# Patient Record
Sex: Male | Born: 2005 | Race: Black or African American | Hispanic: No | Marital: Single | Smoking: Never smoker
Health system: Southern US, Community
[De-identification: ages and names within clinical notes are randomized; demographics above are authoritative.]

---
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
## 2024-01-10 ENCOUNTER — Ambulatory Visit (HOSPITAL_COMMUNITY)
Admission: EM | Admit: 2024-01-10 | Discharge: 2024-01-10 | Disposition: A | Discharge status: Home / Self Care | Payer: Self-pay | Attending: Internal Medicine | Admitting: Internal Medicine

## 2024-01-10 ENCOUNTER — Encounter (HOSPITAL_COMMUNITY): Payer: Self-pay | Admitting: Emergency Medicine

## 2024-01-10 DIAGNOSIS — R0989 Other specified symptoms and signs involving the circulatory and respiratory systems: Secondary | ICD-10-CM

## 2024-01-10 DIAGNOSIS — R21 Rash and other nonspecific skin eruption: Secondary | ICD-10-CM

## 2024-01-10 DIAGNOSIS — R051 Acute cough: Secondary | ICD-10-CM

## 2024-01-10 LAB — POCT INFLUENZA A/B
Influenza A, POC: NEGATIVE
Influenza B, POC: NEGATIVE

## 2024-01-10 LAB — POC SOFIA SARS ANTIGEN FIA: SARS Coronavirus 2 Ag: NEGATIVE

## 2024-01-10 MED ORDER — FLUTICASONE PROPIONATE 50 MCG/ACT NA SUSP: 1.0000 | Freq: Every day | NASAL | 0 refills | Status: AC

## 2024-01-10 MED ORDER — CLOTRIMAZOLE 1 % EX CREA: TOPICAL_CREAM | CUTANEOUS | 0 refills | Status: AC

## 2024-01-10 MED ORDER — CETIRIZINE HCL 10 MG PO TABS: 10.0000 mg | ORAL_TABLET | Freq: Every day | ORAL | 0 refills | Status: AC

## 2024-01-10 NOTE — ED Provider Notes (Signed)
 [facility]    CSN: [id number] Arrival date & time: 01/10/24  1156      History   Chief Complaint Chief Complaint  Patient presents with   Abscess   Cough   Nasal Congestion    HPI Jorge Baxter is a 18 y.o. male.   Patient presents with 2 different chief complaints today.  Patient reports rash in bilateral axilla that been present for about a month.  He states this is the first time he has been evaluated since the symptoms started.  Rash is not painful.  Patient also presents with dry cough and runny nose that has been present for a few days.  Denies fever, body aches, chills.  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  Denies history of asthma.  He has not taken any medication for symptoms.   Abscess Cough   History reviewed. No pertinent past medical history.  There are no active problems to display for this patient.   History reviewed. No pertinent surgical history.     Home Medications    Prior to Admission medications   Medication Sig Start Date End Date Taking? Authorizing Provider  cetirizine  (ZYRTEC ) 10 MG tablet Take 1 tablet (10 mg total) by mouth daily. 01/10/24  Yes [place], Darryle E, FNP  clotrimazole  (LOTRIMIN ) 1 % cream Apply to affected area 2 times daily. Apply to armpits. 01/10/24  Yes Lewi Drost, Darryle BRAVO, FNP  fluticasone  (FLONASE ) 50 MCG/ACT nasal spray Place 1 spray into both nostrils daily. 01/10/24  Yes Hazen Darryle BRAVO, FNP    Family History No family history on file.  Social History Social History   Tobacco Use   Smoking status: Never   Smokeless tobacco: Never  Vaping Use   Vaping status: Never Used  Substance Use Topics   Alcohol use: No   Drug use: No     Allergies   Patient has no known allergies.   Review of Systems Review of Systems Per HPI  Physical Exam Triage Vital Signs ED Triage Vitals  Encounter  Vitals Group     BP 01/10/24 1410 106/65     Girls Systolic BP Percentile --      Girls Diastolic BP Percentile --      Boys Systolic BP Percentile --      Boys Diastolic BP Percentile --      Pulse Rate 01/10/24 1410 73     Resp 01/10/24 1410 14     Temp 01/10/24 1410 98.8 F (37.1 C)     Temp Source 01/10/24 1410 Oral     SpO2 01/10/24 1410 98 %     Weight --      Height --      Head Circumference --      Peak Flow --      Pain Score 01/10/24 1409 0     Pain Loc --      Pain Education --      Exclude from Growth Chart --    No data found.  Updated Vital Signs BP 106/65 (BP Location: Left Arm)   Pulse 73   Temp 98.8 F (37.1 C) (Oral)   Resp 14   SpO2 98%   Visual Acuity Right Eye Distance:   Left Eye Distance:   Bilateral Distance:    Right Eye Near:   Left Eye Near:    Bilateral Near:  Physical Exam Constitutional:      General: He is not in acute distress.    Appearance: Normal appearance. He is not toxic-appearing or diaphoretic.  HENT:     Head: Normocephalic and atraumatic.     Right Ear: Ear canal normal. A middle ear effusion is present. Tympanic membrane is not perforated, erythematous or bulging.     Left Ear: Ear canal normal. A middle ear effusion is present. Tympanic membrane is not perforated, erythematous or bulging.     Nose: Congestion present.     Mouth/Throat:     Mouth: Mucous membranes are moist.     Pharynx: Posterior oropharyngeal erythema present.  Eyes:     Extraocular Movements: Extraocular movements intact.     Conjunctiva/sclera: Conjunctivae normal.     Pupils: Pupils are equal, round, and reactive to light.  Cardiovascular:     Rate and Rhythm: Normal rate and regular rhythm.     Pulses: Normal pulses.     Heart sounds: Normal heart sounds.  Pulmonary:     Effort: Pulmonary effort is normal. No respiratory distress.     Breath sounds: Normal breath sounds. No stridor. No wheezing, rhonchi or rales.  Musculoskeletal:         General: Normal range of motion.     Cervical back: Normal range of motion.  Skin:    General: Skin is warm and dry.     Comments: Mildly erythematous maculopapular rash in a cluster present to bilateral inner arms.  No drainage noted.  Neurological:     General: No focal deficit present.     Mental Status: He is alert and oriented to person, place, and time. Mental status is at baseline.  Psychiatric:        Mood and Affect: Mood normal.        Behavior: Behavior normal.        Thought Content: Thought content normal.        Judgment: Judgment normal.      UC Treatments / Results  Labs (all labs ordered are listed, but only abnormal results are displayed) Labs Reviewed  POC SOFIA SARS ANTIGEN FIA  POCT INFLUENZA A/B    EKG   Radiology No results found.  Procedures Procedures (including critical care time)  Medications Ordered in UC Medications - No data to display  Initial Impression / Assessment and Plan / UC Course  I have reviewed the triage vital signs and the nursing notes.  Pertinent labs & imaging results that were available during my care of the patient were reviewed by me and considered in my medical decision making (see chart for details).     1.  Cough and runny nose  Differential diagnoses include allergic rhinitis versus viral illness.  COVID and flu testing negative.  Will treat with cetirizine and Flonase as patient denies that he takes any daily medications.  Advised fluids, rest, supportive care.  2.  Rash  Rash appears to be most consistent with fungal infection.  Will treat with topical clotrimazole.  There is no sign of bacterial infection on exam that would warrant antibiotic therapy.  He was encouraged to follow-up if symptoms persist or worsen.  Patient verbalized understanding and was agreeable with plan. Final Clinical Impressions(s) / UC Diagnoses   Final diagnoses:  Acute cough  Runny nose  Rash and nonspecific skin eruption      Discharge Instructions      COVID and flu test were negative.  I have prescribed you  2 medications to help with cough and nasal symptoms.  I have also prescribed you a topical medication to apply to the areas of armpits.  If symptoms persist or worsen, please follow-up.     ED Prescriptions     Medication Sig Dispense Auth. Provider   cetirizine  (ZYRTEC ) 10 MG tablet Take 1 tablet (10 mg total) by mouth daily. 30 tablet [place], Andreas Sobolewski E, FNP   fluticasone  (FLONASE ) 50 MCG/ACT nasal spray Place 1 spray into both nostrils daily. 16 g [place], Clotile Whittington E, FNP   clotrimazole  (LOTRIMIN ) 1 % cream Apply to affected area 2 times daily. Apply to armpits. 15 g Hazen Darryle BRAVO, [place]      PDMP not reviewed this encounter.   Hazen Darryle BRAVO, [place] 01/10/24 [phone]

## 2024-01-10 NOTE — Discharge Instructions (Signed)
 COVID and flu test were negative.  I have prescribed you 2 medications to help with cough and nasal symptoms.  I have also prescribed you a topical medication to apply to the areas of armpits.  If symptoms persist or worsen, please follow-up.

## 2024-01-10 NOTE — ED Triage Notes (Signed)
 Pt repots had bump in right axilla for a month. Reports that gotten larger in size. Denies any pain or drainage. Reports has some bumps in left axilla area as well Pt adds for week had weird cough that is dry that happens a couple times a day and has runny nose and wants to see if they are related.
